# Patient Record
Sex: Female | Born: 1990 | Race: Black or African American | Hispanic: No | Marital: Single | Smoking: Current some day smoker
Health system: Southern US, Community
[De-identification: ages and names within clinical notes are randomized; demographics above are authoritative.]

## PROBLEM LIST (undated history)

## (undated) DIAGNOSIS — A6000 Herpesviral infection of urogenital system, unspecified: Secondary | ICD-10-CM

## (undated) HISTORY — DX: Herpesviral infection of urogenital system, unspecified: A60.00

---
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
## 2011-01-06 ENCOUNTER — Inpatient Hospital Stay (INDEPENDENT_AMBULATORY_CARE_PROVIDER_SITE_OTHER)
Admission: RE | Admit: 2011-01-06 | Discharge: 2011-01-06 | Disposition: A | Discharge status: Home / Self Care | Payer: Self-pay | Source: Ambulatory Visit | Attending: Family Medicine | Admitting: Family Medicine

## 2011-01-06 DIAGNOSIS — S40019A Contusion of unspecified shoulder, initial encounter: Secondary | ICD-10-CM

## 2012-07-21 ENCOUNTER — Emergency Department (INDEPENDENT_AMBULATORY_CARE_PROVIDER_SITE_OTHER)
Admission: EM | Admit: 2012-07-21 | Discharge: 2012-07-21 | Disposition: A | Discharge status: Home / Self Care | Payer: Self-pay | Source: Home / Self Care | Attending: Family Medicine | Admitting: Family Medicine

## 2012-07-21 ENCOUNTER — Encounter (HOSPITAL_COMMUNITY): Payer: Self-pay | Admitting: Emergency Medicine

## 2012-07-21 DIAGNOSIS — N760 Acute vaginitis: Secondary | ICD-10-CM

## 2012-07-21 LAB — POCT URINALYSIS DIP (DEVICE)
Bilirubin Urine: NEGATIVE
Glucose, UA: NEGATIVE mg/dL
Hgb urine dipstick: NEGATIVE
Ketones, ur: 15 mg/dL — AB
Nitrite: NEGATIVE
Protein, ur: NEGATIVE mg/dL
Specific Gravity, Urine: 1.025 (ref 1.005–1.030)
Urobilinogen, UA: 0.2 mg/dL (ref 0.0–1.0)
pH: 6.5 (ref 5.0–8.0)

## 2012-07-21 LAB — WET PREP, GENITAL: Yeast Wet Prep HPF POC: NONE SEEN

## 2012-07-21 LAB — POCT PREGNANCY, URINE: Preg Test, Ur: NEGATIVE

## 2012-07-21 MED ORDER — KETOROLAC TROMETHAMINE 30 MG/ML IJ SOLN
INTRAMUSCULAR | Status: AC
  Filled 2012-07-21: qty 1

## 2012-07-21 MED ORDER — METRONIDAZOLE 0.75 % VA GEL: 1.0000 | VAGINAL | Status: DC

## 2012-07-21 MED ORDER — VALACYCLOVIR HCL 1 G PO TABS: 1000.0000 mg | ORAL_TABLET | Freq: Two times a day (BID) | ORAL | Status: DC

## 2012-07-21 NOTE — ED Notes (Signed)
Pt having clear milky discharge, dysuria, itching and a rash for 4 days.

## 2012-07-21 NOTE — ED Provider Notes (Signed)
History     CSN: [id number]  Arrival date & time 07/21/12  7829   First MD Initiated Contact with Patient 07/21/12 1824      Chief Complaint  Patient presents with  . Vaginitis    (Consider location/radiation/quality/duration/timing/severity/associated sxs/prior treatment) Patient is a 21 y.o. female presenting with female genitourinary complaint. The history is provided by the patient.  Female GU Problem Primary symptoms include genital rash, genital itching and dysuria.  Primary symptoms include no pelvic pain and no vaginal bleeding. There has been no fever. The current episode started more than 2 days ago. The problem has been gradually worsening. She is not pregnant. She has not missed her period. Her LMP was weeks ago. The discharge was white and watery. Sexual activity: sexually active.    No past medical history on file.  No past surgical history on file.  No family history on file.  History  Substance Use Topics  . Smoking status: Not on file  . Smokeless tobacco: Not on file  . Alcohol Use: No    OB History    Grav Para Term Preterm Abortions TAB SAB Ect Mult Living                  Review of Systems  Constitutional: Negative.   HENT: Negative.   Genitourinary: Positive for dysuria and vaginal discharge. Negative for vaginal bleeding, vaginal pain, menstrual problem and pelvic pain.    Allergies  Review of patient's allergies indicates no known allergies.  Home Medications   Current Outpatient Rx  Name  Route  Sig  Dispense  Refill  . METRONIDAZOLE 0.75 % VA GEL   Vaginal   Place 1 Applicatorful vaginally 1 day or 1 dose. At bedtime for 5 nights.   70 g   0   . VALACYCLOVIR HCL 1 G PO TABS   Oral   Take 1 tablet (1,000 mg total) by mouth 2 (two) times daily.   20 tablet   0     BP 127/80  Pulse 83  Temp 98.2 F (36.8 C) (Oral)  Resp 18  SpO2 99%  LMP 06/28/2012  Physical Exam  Nursing note and vitals reviewed. Constitutional: She  is oriented to person, place, and time. She appears well-developed and well-nourished.  Abdominal: Soft. Bowel sounds are normal. She exhibits no mass. There is no tenderness. There is no rebound and no guarding.  Genitourinary:    There is lesion on the left labia. Cervix exhibits discharge. Cervix exhibits no motion tenderness. Right adnexum displays no tenderness and no fullness. Left adnexum displays no tenderness and no fullness. There is erythema and tenderness around the vagina. Vaginal discharge found.  Neurological: She is alert and oriented to person, place, and time.  Skin: Skin is warm and dry.    ED Course  Procedures (including critical care time)  Labs Reviewed  POCT URINALYSIS DIP (DEVICE) - Abnormal; Notable for the following:    Ketones, ur 15 (*)     Leukocytes, UA TRACE (*)  Biochemical Testing Only. Please order routine urinalysis from main lab if confirmatory testing is needed.   All other components within normal limits  POCT PREGNANCY, URINE   No results found.   1. Vaginitis and vulvovaginitis       MDM          Linna Hoff, MD 07/21/12 [phone]

## 2012-07-23 LAB — HERPES SIMPLEX VIRUS CULTURE: Culture: DETECTED

## 2012-07-23 LAB — GC/CHLAMYDIA PROBE AMP
CT Probe RNA: POSITIVE — AB
GC Probe RNA: NEGATIVE

## 2012-07-24 NOTE — ED Notes (Signed)
Final report of lab results reviewed by Neville Route, PA, authorized metronidazole 500 mg, PO, BID x 7 days, and Zithromax 1 gm as a a one time dose for treatment of positive trichomonas and positive chlamydia . Called patient , and after verifying ID, discussed positive findings. Called Rx in for patient to [facility], [place] market at pt request. Patient informed her partner can cause her to be re-infected if they are not treated as well. Advised re: safer sex. DHHS # [id number] completed and faxed to [facility] Department , confirmation copy recieved

## 2012-07-28 NOTE — ED Notes (Signed)
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 Pt. adequately treated with Valtrex. Vassie Moselle 07/28/2012

## 2012-07-30 ENCOUNTER — Telehealth (HOSPITAL_COMMUNITY): Payer: Self-pay | Admitting: *Deleted

## 2012-07-30 NOTE — ED Notes (Signed)
Pt. verified x 2 and given Herpes result.  Pt. Instructed to notify her partner. You can pass the virus even when you don't have an outbreak, so always practice safe sex. Get treated for each outbreak with Acyclovir or Valtrex. You may want to get an OB-GYN doctor who can call in a prescription for you when you have an outbreak or they can give you a Rx for 1 year so she can refill as needed.  If frequent outbreaks they can give you suppressive therapy.  Pt. voiced understanding. Cassandra Norton 07/30/2012

## 2016-08-07 ENCOUNTER — Encounter (HOSPITAL_COMMUNITY): Payer: Self-pay | Admitting: Emergency Medicine

## 2016-08-07 DIAGNOSIS — F17200 Nicotine dependence, unspecified, uncomplicated: Secondary | ICD-10-CM | POA: Insufficient documentation

## 2016-08-07 DIAGNOSIS — R05 Cough: Secondary | ICD-10-CM | POA: Insufficient documentation

## 2016-08-07 NOTE — ED Triage Notes (Signed)
Pt from home with complaints of dry cough and congestion x 1 week as well as diarrhea. Pt states she has had diarrhea about every hour today. Pt denies emesis and denies urinary symptoms. Pt is not febrile and is not tachycardic nor hypotensive

## 2016-08-08 ENCOUNTER — Emergency Department (HOSPITAL_COMMUNITY): Payer: Self-pay

## 2016-08-08 ENCOUNTER — Emergency Department (HOSPITAL_COMMUNITY)
Admission: EM | Admit: 2016-08-08 | Discharge: 2016-08-08 | Disposition: A | Discharge status: Home / Self Care | Payer: Self-pay | Attending: Emergency Medicine | Admitting: Emergency Medicine

## 2016-08-08 DIAGNOSIS — R05 Cough: Secondary | ICD-10-CM

## 2016-08-08 DIAGNOSIS — R059 Cough, unspecified: Secondary | ICD-10-CM

## 2016-08-08 LAB — CBC
HCT: 40.3 % (ref 36.0–46.0)
Hemoglobin: 13.2 g/dL (ref 12.0–15.0)
MCH: 27.3 pg (ref 26.0–34.0)
MCHC: 32.8 g/dL (ref 30.0–36.0)
MCV: 83.3 fL (ref 78.0–100.0)
Platelets: 245 10*3/uL (ref 150–400)
RBC: 4.84 MIL/uL (ref 3.87–5.11)
RDW: 13.9 % (ref 11.5–15.5)
WBC: 8.4 10*3/uL (ref 4.0–10.5)

## 2016-08-08 LAB — URINALYSIS, ROUTINE W REFLEX MICROSCOPIC
Bilirubin Urine: NEGATIVE
Glucose, UA: NEGATIVE mg/dL
Hgb urine dipstick: NEGATIVE
Ketones, ur: NEGATIVE mg/dL
Nitrite: NEGATIVE
Protein, ur: NEGATIVE mg/dL
Specific Gravity, Urine: 1.031 — ABNORMAL HIGH (ref 1.005–1.030)
pH: 5.5 (ref 5.0–8.0)

## 2016-08-08 LAB — COMPREHENSIVE METABOLIC PANEL
ALT: 18 U/L (ref 14–54)
AST: 24 U/L (ref 15–41)
Albumin: 4.5 g/dL (ref 3.5–5.0)
Alkaline Phosphatase: 67 U/L (ref 38–126)
Anion gap: 5 (ref 5–15)
BUN: 12 mg/dL (ref 6–20)
CO2: 28 mmol/L (ref 22–32)
Calcium: 9.4 mg/dL (ref 8.9–10.3)
Chloride: 108 mmol/L (ref 101–111)
Creatinine, Ser: 0.73 mg/dL (ref 0.44–1.00)
GFR calc Af Amer: >60 mL/min (ref >60)
GFR calc non Af Amer: >60 mL/min (ref >60)
Glucose, Bld: 89 mg/dL (ref 65–99)
Potassium: 3.3 mmol/L — ABNORMAL LOW (ref 3.5–5.1)
Sodium: 141 mmol/L (ref 135–145)
Total Bilirubin: 0.4 mg/dL (ref 0.3–1.2)
Total Protein: 7.7 g/dL (ref 6.5–8.1)

## 2016-08-08 LAB — RAPID STREP SCREEN (MED CTR MEBANE ONLY): Streptococcus, Group A Screen (Direct): NEGATIVE

## 2016-08-08 LAB — URINE MICROSCOPIC-ADD ON: RBC / HPF: NONE SEEN RBC/hpf (ref 0–5)

## 2016-08-08 LAB — LIPASE, BLOOD: Lipase: 29 U/L (ref 11–51)

## 2016-08-08 LAB — POC URINE PREG, ED: Preg Test, Ur: NEGATIVE

## 2016-08-08 MED ORDER — DEXTROMETHORPHAN-GUAIFENESIN 5-100 MG/5ML PO LIQD: 5.0000 mL | Freq: Two times a day (BID) | ORAL | 0 refills | Status: AC

## 2016-08-08 NOTE — ED Provider Notes (Signed)
[facility]-EMERGENCY DEPT Provider Note   CSN: [id number] Arrival date & time: 08/07/16  2259     History   Chief Complaint Chief Complaint  Patient presents with  . Diarrhea  . Cough  . Nasal Congestion  . Sore Throat    HPI Cassandra Norton is a 25 y.o. female.  Patient presents to the emergency department tonight stating that he think I have the flu has been sick for the past week with nasal congestion and sore throat cough and loose stools using over-the-counter NyQuil for the cough is not getting any better she is able to eat and drink without difficulty loose stools or slowing down she has subjective fever.      History reviewed. No pertinent past medical history.  There are no active problems to display for this patient.   History reviewed. No pertinent surgical history.  OB History    No data available       Home Medications    Prior to Admission medications   Medication Sig Start Date End Date Taking? Authorizing Provider  Dextromethorphan-Guaifenesin (DELSYM COUGH/CHEST CONGEST DM) 5-100 MG/5ML LIQD Take 5 mLs by mouth 2 (two) times daily. 08/08/16 08/13/17  Earley FavorGail Lasheka Kempner, NP  metroNIDAZOLE (METROGEL VAGINAL) 0.75 % vaginal gel Place 1 Applicatorful vaginally 1 day or 1 dose. At bedtime for 5 nights. 07/21/12   Linna HoffJames D Kindl, MD  valACYclovir (VALTREX) 1000 MG tablet Take 1 tablet (1,000 mg total) by mouth 2 (two) times daily. 07/21/12   Linna HoffJames D Kindl, MD    Family History No family history on file.  Social History Social History  Substance Use Topics  . Smoking status: Current Some Day Smoker  . Smokeless tobacco: Never Used  . Alcohol use Yes     Comment: rarely     Allergies   Patient has no known allergies.   Review of Systems Review of Systems  Constitutional: Positive for fever.  HENT: Positive for congestion and sore throat. Negative for rhinorrhea.   Respiratory: Positive for cough.   Gastrointestinal: Positive for diarrhea. Negative  for nausea and vomiting.  Genitourinary: Negative for dysuria.  All other systems reviewed and are negative.    Physical Exam Updated Vital Signs BP 118/78 (BP Location: Right Arm)   Pulse 82   Temp 97.9 F (36.6 C) (Oral)   Resp 18   Ht 5\' 7"  (1.702 m)   Wt 68.9 kg   LMP 07/07/2016   SpO2 98%   BMI 23.81 kg/m   Physical Exam  Constitutional: She appears well-developed and well-nourished.  HENT:  Head: Normocephalic.  Eyes: Pupils are equal, round, and reactive to light.  Neck: Normal range of motion.  Cardiovascular: Normal rate.   Pulmonary/Chest: Effort normal. No respiratory distress. She has no wheezes.  Abdominal: Soft. She exhibits no distension. There is no tenderness.  Musculoskeletal: Normal range of motion.  Lymphadenopathy:    She has no cervical adenopathy.  Neurological: She is alert.  Skin: Skin is warm.  Nursing note and vitals reviewed.    ED Treatments / Results  Labs (all labs ordered are listed, but only abnormal results are displayed) Labs Reviewed  COMPREHENSIVE METABOLIC PANEL - Abnormal; Notable for the following:       Result Value   Potassium 3.3 (*)    All other components within normal limits  URINALYSIS, ROUTINE W REFLEX MICROSCOPIC (NOT AT [facility]) - Abnormal; Notable for the following:    APPearance CLOUDY (*)    Specific Gravity, Urine  1.031 (*)    Leukocytes, UA SMALL (*)    All other components within normal limits  URINE MICROSCOPIC-ADD ON - Abnormal; Notable for the following:    Squamous Epithelial / LPF 6-30 (*)    Bacteria, UA FEW (*)    All other components within normal limits  RAPID STREP SCREEN (NOT AT [facility])  CULTURE, GROUP A STREP ([facility])  LIPASE, BLOOD  CBC  POC URINE PREG, ED    EKG  EKG Interpretation None       Radiology Dg Chest 2 View  Result Date: 08/08/2016 CLINICAL DATA:  Acute onset of cough.  Initial encounter. EXAM: CHEST  2 VIEW COMPARISON:  None. FINDINGS: The lungs are well-aerated and  clear. There is no evidence of focal opacification, pleural effusion or pneumothorax. The heart is normal in size; the mediastinal contour is within normal limits. No acute osseous abnormalities are seen. IMPRESSION: No acute cardiopulmonary process seen. Electronically Signed   By: Roanna RaiderJeffery  Chang M.D.   On: 08/08/2016 05:30    Procedures Procedures (including critical care time)  Medications Ordered in ED Medications - No data to display   Initial Impression / Assessment and Plan / ED Course  I have reviewed the triage vital signs and the nursing notes.  Pertinent labs & imaging results that were available during my care of the patient were reviewed by me and considered in my medical decision making (see chart for details).  Clinical Course   Lab work reviewed all within normal parameters were obtained chest x-ray.     Final Clinical Impressions(s) / ED Diagnoses   Final diagnoses:  Cough    New Prescriptions New Prescriptions   DEXTROMETHORPHAN-GUAIFENESIN (DELSYM COUGH/CHEST CONGEST DM) 5-100 MG/5ML LIQD    Take 5 mLs by mouth 2 (two) times daily.     Earley FavorGail Judy Goodenow, NP 08/08/16 [id number]    Geoffery Lyonsouglas Delo, MD 08/08/16 [phone]

## 2016-08-08 NOTE — Discharge Instructions (Signed)
Her chest x-ray is normal and given a prescription for cough medicine. Please use this as directed. Stay hydrated

## 2016-08-10 LAB — CULTURE, GROUP A STREP (THRC)

## 2017-12-11 ENCOUNTER — Ambulatory Visit: Payer: Self-pay

## 2017-12-12 ENCOUNTER — Ambulatory Visit (INDEPENDENT_AMBULATORY_CARE_PROVIDER_SITE_OTHER): Payer: Self-pay | Admitting: General Practice

## 2017-12-12 DIAGNOSIS — Z3202 Encounter for pregnancy test, result negative: Secondary | ICD-10-CM

## 2017-12-12 DIAGNOSIS — N912 Amenorrhea, unspecified: Secondary | ICD-10-CM

## 2017-12-12 LAB — POCT PREGNANCY, URINE: Preg Test, Ur: NEGATIVE

## 2017-12-12 NOTE — Progress Notes (Signed)
Patient here for UPT today. UPT -. Patient states she hasn't had any positive tests at home but hasn't had a period since October. Recommended she follow up with one of our providers. Patient verbalized understanding & had no questions

## 2018-01-08 ENCOUNTER — Ambulatory Visit (INDEPENDENT_AMBULATORY_CARE_PROVIDER_SITE_OTHER): Payer: Self-pay | Admitting: Obstetrics & Gynecology

## 2018-01-08 ENCOUNTER — Encounter: Payer: Self-pay | Admitting: Obstetrics & Gynecology

## 2018-01-08 VITALS — BP 125/89 | HR 89 | Wt 178.2 lb

## 2018-01-08 DIAGNOSIS — N912 Amenorrhea, unspecified: Secondary | ICD-10-CM

## 2018-01-08 DIAGNOSIS — L680 Hirsutism: Secondary | ICD-10-CM

## 2018-01-08 NOTE — Progress Notes (Signed)
Pt declines [employer]

## 2018-01-08 NOTE — Progress Notes (Signed)
Patient ID: Cassandra Norton, female   DOB: 03/20/1991, 27 y.o.   MRN: [id number]  Chief Complaint  Patient presents with  . Amenorrhea    HPI Cassandra Norton is a 27 y.o. G0 female who presents to [facility] clinic due to amenorrhea since October of 2018, but admits to having 1 episode of spotting about 3-4 months ago. Patient reports her first menses was when she was about 9/26 years old and overall was pretty regular. She admits to being amenorrhea one more time in her past when she was around [age] years old, but eventually started menstruating again. She denies ever being on birth control in her past. She is currently sexually active without protection with the same partner for 7 years. Her last pap smear was in 2018 and is unaware of her results. She has a positive history for genital herpes with her last outbreak being in 2013 when she was first diagnosed.    -denies frequent abdominal pain, changes in BM, changes in urination -starting to think about wanting a child -Chlamydia? Genital herpes instead of chlamydia - last pap 2013- unknown if normal? HPI  Past Medical History:  Diagnosis Date  . Genital herpes     No past surgical history on file.  No family history on file.  Social History Social History   Tobacco Use  . Smoking status: Current Some Day Smoker  . Smokeless tobacco: Never Used  Substance Use Topics  . Alcohol use: Yes    Comment: rarely  . Drug use: No    No Known Allergies  No current outpatient medications on file.   No current facility-administered medications for this visit.     Review of Systems Review of Systems  Constitutional: Positive for appetite change (loss of appetite around when amenorrhea started), fatigue (started around October 2018) and unexpected weight change (gained 20-30lbs in the past year).  HENT: Negative.   Eyes: Negative.   Respiratory: Positive for shortness of breath (exertional for a few months).    Cardiovascular: Negative.   Gastrointestinal: Negative.   Endocrine: Positive for cold intolerance.  Genitourinary: Negative for genital sores (last outbreak was 2013).  Musculoskeletal: Negative.   Skin: Negative.     Blood pressure 125/89, pulse 89, weight 178 lb 3.2 oz (80.8 kg).  Physical Exam Physical Exam  Constitutional: She appears well-developed and well-nourished. No distress.  Cardiovascular: Normal rate.  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

## 2018-01-09 LAB — TESTOSTERONE, FREE, TOTAL, SHBG
Sex Hormone Binding: 24.9 nmol/L (ref 24.6–122.0)
Testosterone, Free: 5.5 pg/mL — ABNORMAL HIGH (ref 0.0–4.2)
Testosterone: 63 ng/dL — ABNORMAL HIGH (ref 8–48)

## 2018-01-09 LAB — TSH: TSH: 0.807 u[IU]/mL (ref 0.450–4.500)

## 2018-01-09 LAB — LUTEINIZING HORMONE: LH: 19.7 m[IU]/mL

## 2018-01-09 LAB — PROLACTIN: Prolactin: 11.5 ng/mL (ref 4.8–23.3)

## 2018-01-09 LAB — FOLLICLE STIMULATING HORMONE: FSH: 5.7 m[IU]/mL

## 2019-03-26 ENCOUNTER — Encounter (HOSPITAL_COMMUNITY): Payer: Self-pay | Admitting: Emergency Medicine

## 2019-03-26 ENCOUNTER — Emergency Department (HOSPITAL_COMMUNITY): Payer: Self-pay

## 2019-03-26 ENCOUNTER — Other Ambulatory Visit: Payer: Self-pay

## 2019-03-26 ENCOUNTER — Emergency Department (HOSPITAL_COMMUNITY)
Admission: EM | Admit: 2019-03-26 | Discharge: 2019-03-26 | Disposition: A | Discharge status: Home / Self Care | Payer: Self-pay | Attending: Emergency Medicine | Admitting: Emergency Medicine

## 2019-03-26 DIAGNOSIS — W450XXA Nail entering through skin, initial encounter: Secondary | ICD-10-CM | POA: Insufficient documentation

## 2019-03-26 DIAGNOSIS — S91331A Puncture wound without foreign body, right foot, initial encounter: Secondary | ICD-10-CM | POA: Insufficient documentation

## 2019-03-26 DIAGNOSIS — Z23 Encounter for immunization: Secondary | ICD-10-CM | POA: Insufficient documentation

## 2019-03-26 DIAGNOSIS — Y999 Unspecified external cause status: Secondary | ICD-10-CM | POA: Insufficient documentation

## 2019-03-26 DIAGNOSIS — Z720 Tobacco use: Secondary | ICD-10-CM | POA: Insufficient documentation

## 2019-03-26 DIAGNOSIS — Y92019 Unspecified place in single-family (private) house as the place of occurrence of the external cause: Secondary | ICD-10-CM | POA: Insufficient documentation

## 2019-03-26 DIAGNOSIS — M79671 Pain in right foot: Secondary | ICD-10-CM

## 2019-03-26 DIAGNOSIS — Y9301 Activity, walking, marching and hiking: Secondary | ICD-10-CM | POA: Insufficient documentation

## 2019-03-26 MED ORDER — TETANUS-DIPHTH-ACELL PERTUSSIS 5-2.5-18.5 LF-MCG/0.5 IM SUSP
0.5000 mL | Freq: Once | INTRAMUSCULAR | Status: AC
  Administered 2019-03-26: 0.5 mL via INTRAMUSCULAR
  Filled 2019-03-26: qty 0.5

## 2019-03-26 MED ORDER — BACITRACIN ZINC 500 UNIT/GM EX OINT
TOPICAL_OINTMENT | Freq: Once | CUTANEOUS | Status: AC
  Administered 2019-03-26: 1 via TOPICAL
  Filled 2019-03-26: qty 0.9

## 2019-03-26 MED ORDER — ACETAMINOPHEN 325 MG PO TABS
650.0000 mg | ORAL_TABLET | Freq: Once | ORAL | Status: AC
  Administered 2019-03-26: 650 mg via ORAL
  Filled 2019-03-26: qty 2

## 2019-03-26 NOTE — Discharge Instructions (Addendum)
You have been seen today right foot pain after stepping on a nail. Please read and follow all provided instructions. Return to the emergency room for worsening condition or new concerning symptoms including signs of infection: surrounding redness, pus draining, streaking redness or you develop a fever.  Your tetanus was updated at today's emergency department visit.  1. Medications:  You can take Tylenol and ibuprofen for pain as needed.  Please take as directed. Continue usual home medications Take medications as prescribed. Please review all of the medicines and only take them if you do not have an allergy to them.   2. Treatment: rest, drink plenty of fluids.  Continue warm soaks. 3. Follow Up: Please follow up with your primary doctor in 2-5 days for discussion of your diagnoses and further evaluation after today's visit; Call today to arrange your follow up.  If you do not have a primary care doctor use the resource guide provided to find one;   It is also a possibility that you have an allergic reaction to any of the medicines that you have been prescribed - Everybody reacts differently to medications and while MOST people have no trouble with most medicines, you may have a reaction such as nausea, vomiting, rash, swelling, shortness of breath. If this is the case, please stop taking the medicine immediately and contact your physician.  ?

## 2019-03-26 NOTE — ED Triage Notes (Signed)
Stepped on rusty nail yesterday ,  Has a puncture wound no redness noted , has been soaking it in H2O2 UNKN last tetanus

## 2019-03-26 NOTE — ED Provider Notes (Signed)
[facility]   CSN: [id number] Arrival date & time: 03/26/19  1354    History   Chief Complaint Chief Complaint  Patient presents with   Foot Pain    HPI Cassandra PhoenixShameka D Weismann is a 28 y.o. female presents to emergency department today with chief complaint of puncture wound to right foot.  Patient states she was walking around her house yesterday wearing a sock when she stepped on a rusty nail.  She immediately pulled her foot away and saw a puncture wound on the bottom of her foot.  The nail was not embedded in her foot. She has pain localized to wound that she describes as aching and is worse when walking. She has tried foot in warm water without symptom relief.  She is unsure of last tetanus immunization.  History provided by patient with additional history obtained from chart review.    Past Medical History:  Diagnosis Date   Genital herpes     There are no active problems to display for this patient.   History reviewed. No pertinent surgical history.   OB History    Gravida  0   Para  0   Term  0   Preterm  0   AB  0   Living  0     SAB  0   TAB  0   Ectopic  0   Multiple  0   Live Births  0            Home Medications    Prior to Admission medications   Not on File    Family History No family history on file.  Social History Social History   Tobacco Use   Smoking status: Current Some Day Smoker   Smokeless tobacco: Never Used  Substance Use Topics   Alcohol use: Yes    Comment: rarely   Drug use: No     Allergies   Patient has no known allergies.   Review of Systems Review of Systems  Constitutional: Negative for chills and fever.  Musculoskeletal: Positive for arthralgias. Negative for gait problem, joint swelling and myalgias.  Skin: Positive for wound.  Allergic/Immunologic: Negative for immunocompromised state.  Neurological: Negative for weakness and numbness.       Physical Exam Updated Vital Signs BP 120/84 (BP Location: Right Arm)    Pulse 75    Temp 98.2 F (36.8 C) (Oral)    Resp 14    SpO2 95%   Physical Exam Vitals signs and nursing note reviewed.  Constitutional:      Appearance: She is well-developed. She is not ill-appearing or toxic-appearing.  HENT:     Head: Normocephalic and atraumatic.     Nose: Nose normal.  Eyes:     General: No scleral icterus.       Right eye: No discharge.        Left eye: No discharge.     Conjunctiva/sclera: Conjunctivae normal.  Neck:     Musculoskeletal: Normal range of motion.     Vascular: No JVD.  Cardiovascular:     Rate and Rhythm: Normal rate and regular rhythm.     Pulses: Normal pulses.     Heart sounds: Normal heart sounds.  Pulmonary:     Effort: Pulmonary effort is normal.     Breath sounds: Normal breath sounds.  Abdominal:     General: There is no distension.  Musculoskeletal: Normal range of motion.  Skin:  General: Skin is warm and dry.     Comments: Small puncture wound to plantar aspect of right foot.  No surrounding erythema or edema.  No discharge noted. Full ROM of right ankle. Cap refill <2 seconds, able to wiggle all toes.  Neurological:     Mental Status: She is oriented to person, place, and time.     GCS: GCS eye subscore is 4. GCS verbal subscore is 5. GCS motor subscore is 6.     Sensory: Sensory deficit:      Comments: Fluent speech, no facial droop.  Psychiatric:        Behavior: Behavior normal.      ED Treatments / Results  Labs (all labs ordered are listed, but only abnormal results are displayed) Labs Reviewed - No data to display  EKG None  Radiology Dg Foot Complete Right  Result Date: 03/26/2019 CLINICAL DATA:  Stepped on rusty nail EXAM: RIGHT FOOT COMPLETE - 3+ VIEW COMPARISON:  None. FINDINGS: No fracture or dislocation of the right foot. Small plantar calcaneal spur. Joint spaces are well preserved. There is no radiopaque foreign body  identified. IMPRESSION: No fracture or dislocation of the right foot. Small plantar calcaneal spur. Joint spaces are well preserved. There is no radiopaque foreign body identified. Electronically Signed   By: Eddie Candle M.D.   On: 03/26/2019 15:09    Procedures Procedures (including critical care time)  Medications Ordered in ED Medications  Tdap (BOOSTRIX) injection 0.5 mL (has no administration in time range)  acetaminophen (TYLENOL) tablet 650 mg (has no administration in time range)  bacitracin ointment (has no administration in time range)     Initial Impression / Assessment and Plan / ED Course  I have reviewed the triage vital signs and the nursing notes.  Pertinent labs & imaging results that were available during my care of the patient were reviewed by me and considered in my medical decision making (see chart for details).  Patient presents to the ED with complaints of pain to the right foot s/p injury of stepping on nail yesterday while wearing sock. Exam without obvious deformity.  Small puncture wound on plantar aspect of right foot without signs of infection.  ROM intact. Non tender to palpation . NVI distally. Xray negative for fracture/dislocation no foreign body seen.  Tetanus updated.  Wound irrigated and wound care provided. I discussed results, treatment plan, need for follow-up, and strict return precautions with the patient. Provided opportunity for questions, patient confirmed understanding and are in agreement with plan.    This note was prepared using Dragon voice recognition software and may include unintentional dictation errors due to the inherent limitations of voice recognition software.    Final Clinical Impressions(s) / ED Diagnoses   Final diagnoses:  Foot pain, right    ED Discharge Orders    None       Flint Melter 03/26/19 1634    Milton Ferguson, MD 03/27/19 [phone]

## 2020-05-16 IMAGING — CR RIGHT FOOT COMPLETE - 3+ VIEW
3 series · 3 of 3 positions shown · non-contrast
Comparison: None.

CLINICAL DATA: Stepped on [address] [place]

EXAM:
RIGHT FOOT COMPLETE - 3+ VIEW

[foot ap]
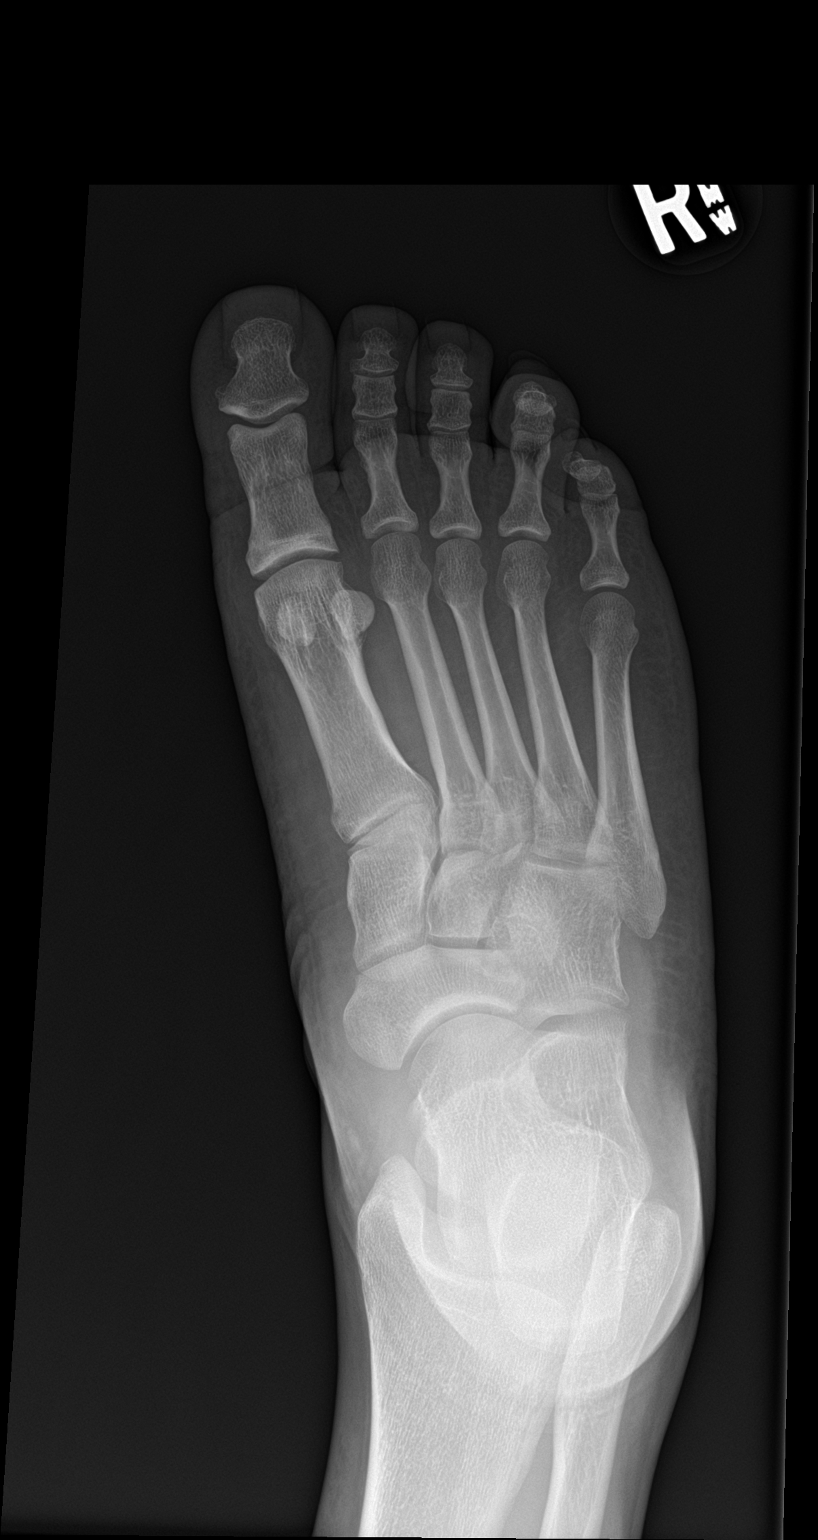

[foot obl]
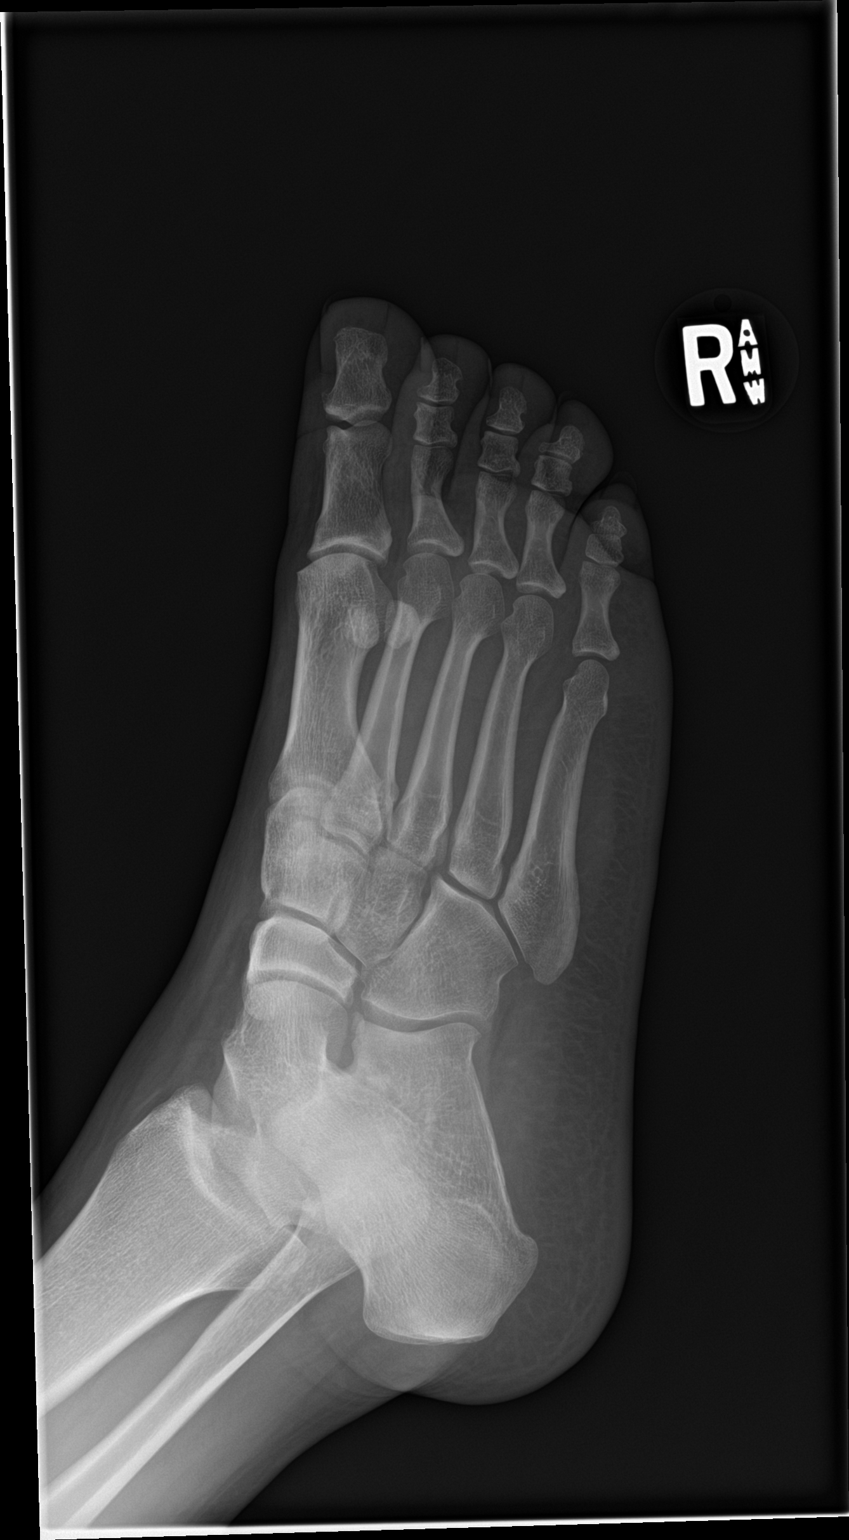

[foot lat]
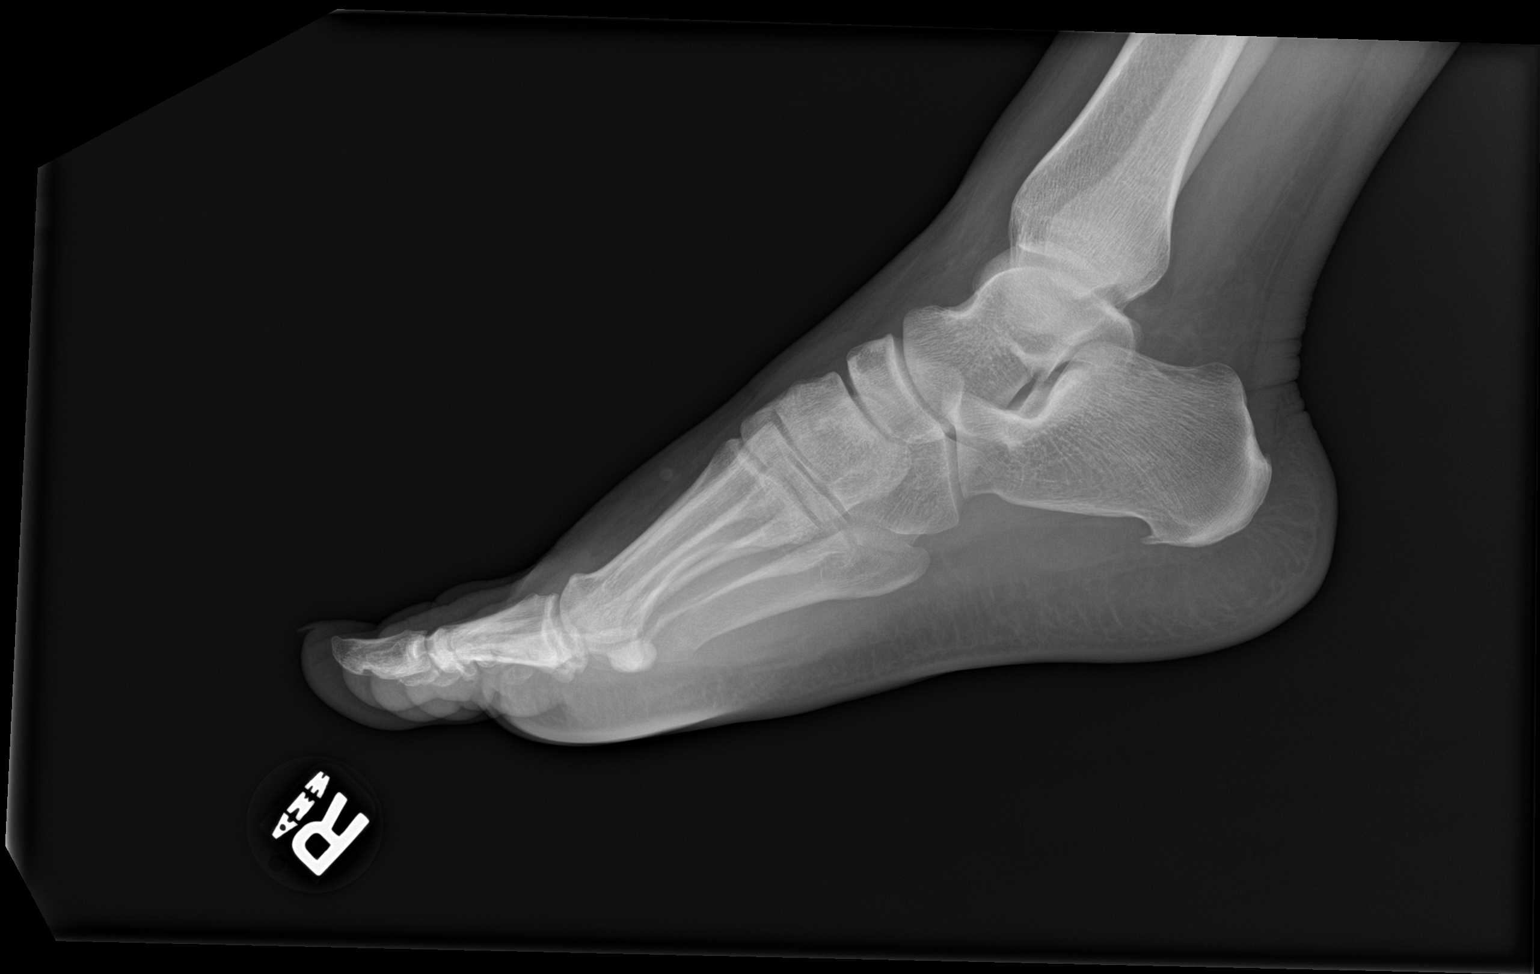

[3 of 3 positions shown; findings below may reference images not displayed]

FINDINGS: No fracture or dislocation of the right foot. Small plantar
calcaneal spur. Joint spaces are well preserved. There is no
radiopaque foreign body identified.
IMPRESSION: No fracture or dislocation of the right foot. Small plantar
calcaneal spur. Joint spaces are well preserved. There is no
radiopaque foreign body identified.

## 2024-10-12 ENCOUNTER — Other Ambulatory Visit: Payer: Self-pay

## 2024-10-12 ENCOUNTER — Ambulatory Visit (HOSPITAL_COMMUNITY)
Admission: EM | Admit: 2024-10-12 | Discharge: 2024-10-12 | Disposition: A | Discharge status: Home / Self Care | Payer: Self-pay | Source: Home / Self Care | Attending: Family Medicine | Admitting: Family Medicine

## 2024-10-12 ENCOUNTER — Encounter (HOSPITAL_COMMUNITY): Payer: Self-pay | Admitting: Emergency Medicine

## 2024-10-12 DIAGNOSIS — J111 Influenza due to unidentified influenza virus with other respiratory manifestations: Secondary | ICD-10-CM

## 2024-10-12 DIAGNOSIS — J069 Acute upper respiratory infection, unspecified: Secondary | ICD-10-CM

## 2024-10-12 MED ORDER — HYDROCODONE BIT-HOMATROP MBR 5-1.5 MG/5ML PO SOLN: 5.0000 mL | Freq: Four times a day (QID) | ORAL | 0 refills | Status: AC | PRN

## 2024-10-12 NOTE — ED Triage Notes (Signed)
 Symptoms started 3 days ago.  Patient has a headache, muscle pain, coughing and sneezing.    Has taken benadryl

## 2024-10-12 NOTE — ED Notes (Signed)
 Reports she spent time with a niece 4 days ago.  Has since found out child has the flu

## 2024-10-13 NOTE — ED Provider Notes (Signed)
" °  [facility]   [id number] 10/12/24 Arrival Time: 1640  ASSESSMENT & PLAN:  1. Viral URI with cough   2. Influenza-like illness    Discussed typical duration of likely viral illness. Work note provided. OTC symptom care as needed.  Meds ordered this encounter  Medications   HYDROcodone  bit-homatropine (HYCODAN) 5-1.5 MG/5ML syrup    Sig: Take 5 mLs by mouth every 6 (six) hours as needed for cough.    Dispense:  90 mL    Refill:  0     Follow-up Information     [facility].   Specialty: Urgent Care Why: If worsening or failing to improve as anticipated. Contact information: [address] [place]  [address] [phone]                Reviewed expectations re: course of current medical issues. Questions answered. Outlined signs and symptoms indicating need for more acute intervention. Understanding verbalized. After Visit Summary given.   SUBJECTIVE: History from: Patient. Cassandra Norton is a 34 y.o. female. Symptoms started 3 days ago.  Patient has a headache, muscle pain, coughing and sneezing.    Has taken benadryl Denies: fever and difficulty breathing. Normal PO intake without n/v/d.  OBJECTIVE:  Vitals:   10/12/24 1854  BP: 126/89  Pulse: 85  Resp: 18  Temp: 98.2 F (36.8 C)  TempSrc: Oral  SpO2: 96%    General appearance: alert; no distress Eyes: PERRLA; EOMI; conjunctiva normal HENT: [place]; AT; with nasal congestion Neck: supple  Lungs: speaks full sentences without difficulty; unlabored; coughing Extremities: no edema Skin: warm and dry Neurologic: normal gait Psychological: alert and cooperative; normal mood and affect  Labs:  Labs Reviewed - No data to display  Imaging: No results found.  Allergies[1]  Past Medical History:  Diagnosis Date   Genital herpes    Social History   Socioeconomic History   Marital status: Single    Spouse name: Not on file   Number  of children: Not on file   Years of education: Not on file   Highest education level: Not on file  Occupational History   Not on file  Tobacco Use   Smoking status: Some Days   Smokeless tobacco: Never  Vaping Use   Vaping status: Some Days  Substance and Sexual Activity   Alcohol use: Yes    Comment: rarely   Drug use: No   Sexual activity: Yes    Birth control/protection: None  Other Topics Concern   Not on file  Social History Narrative   Not on file   Social Drivers of Health   Tobacco Use: High Risk (10/12/2024)   Patient History    Smoking Tobacco Use: Some Days    Smokeless Tobacco Use: Never    Passive Exposure: Not on file  Financial Resource Strain: Not on file  Food Insecurity: Not on file  Transportation Needs: Not on file  Physical Activity: Not on file  Stress: Not on file  Social Connections: Not on file  Intimate Partner Violence: Not on file  Depression (EYV7-0): Not on file  Alcohol Screen: Not on file  Housing: Not on file  Utilities: Not on file  Health Literacy: Not on file   History reviewed. No pertinent family history. History reviewed. No pertinent surgical history.    [1] No Known Allergies    Rolinda Rogue, MD 10/13/24 [phone]  "
